# Patient Record
Sex: Male | Born: 1985 | Race: Black or African American | Hispanic: No | Marital: Single | Smoking: Never smoker
Health system: Southern US, Community
[De-identification: ages and names within clinical notes are randomized; demographics above are authoritative.]

---

## 2014-07-23 ENCOUNTER — Encounter (HOSPITAL_COMMUNITY): Payer: Self-pay | Admitting: Cardiology

## 2014-07-23 ENCOUNTER — Emergency Department (HOSPITAL_COMMUNITY)

## 2014-07-23 ENCOUNTER — Emergency Department (HOSPITAL_COMMUNITY)
Admission: EM | Admit: 2014-07-23 | Discharge: 2014-07-23 | Disposition: A | Discharge status: Home / Self Care | Attending: Emergency Medicine | Admitting: Emergency Medicine

## 2014-07-23 DIAGNOSIS — S20212A Contusion of left front wall of thorax, initial encounter: Secondary | ICD-10-CM

## 2014-07-23 DIAGNOSIS — Y9289 Other specified places as the place of occurrence of the external cause: Secondary | ICD-10-CM | POA: Diagnosis not present

## 2014-07-23 DIAGNOSIS — Y9389 Activity, other specified: Secondary | ICD-10-CM | POA: Insufficient documentation

## 2014-07-23 DIAGNOSIS — Y998 Other external cause status: Secondary | ICD-10-CM | POA: Diagnosis not present

## 2014-07-23 DIAGNOSIS — S29001A Unspecified injury of muscle and tendon of front wall of thorax, initial encounter: Secondary | ICD-10-CM | POA: Diagnosis present

## 2014-07-23 MED ORDER — IBUPROFEN 800 MG PO TABS: 800.0000 mg | ORAL_TABLET | Freq: Three times a day (TID) | ORAL | Status: AC

## 2014-07-23 NOTE — Discharge Instructions (Signed)
Rib Contusion °A rib contusion (bruise) can occur by a blow to the chest or by a fall against a hard object. Usually these will be much better in a couple weeks. If X-rays were taken today and there are no broken bones (fractures), the diagnosis of bruising is made. However, broken ribs may not show up for several days, or may be discovered later on a routine X-ray when signs of healing show up. If this happens to you, it does not mean that something was missed on the X-ray, but simply that it did not show up on the first X-rays. Earlier diagnosis will not usually change the treatment. °HOME CARE INSTRUCTIONS  °· Avoid strenuous activity. Be careful during activities and avoid bumping the injured ribs. Activities that pull on the injured ribs and cause pain should be avoided, if possible. °· For the first day or two, an ice pack used every 20 minutes while awake may be helpful. Put ice in a plastic bag and put a towel between the bag and the skin. °· Eat a normal, well-balanced diet. Drink plenty of fluids to avoid constipation. °· Take deep breaths several times a day to keep lungs free of infection. Try to cough several times a day. Splint the injured area with a pillow while coughing to ease pain. Coughing can help prevent pneumonia. °· Wear a rib belt or binder only if told to do so by your caregiver. If you are wearing a rib belt or binder, you must do the breathing exercises as directed by your caregiver. If not used properly, rib belts or binders restrict breathing which can lead to pneumonia. °· Only take over-the-counter or prescription medicines for pain, discomfort, or fever as directed by your caregiver. °SEEK MEDICAL CARE IF:  °· You or your child has an oral temperature above 102° F (38.9° C). °· Your baby is older than 3 months with a rectal temperature of 100.5° F (38.1° C) or higher for more than 1 day. °· You develop a cough, with thick or bloody sputum. °SEEK IMMEDIATE MEDICAL CARE IF:  °· You  have difficulty breathing. °· You feel sick to your stomach (nausea), have vomiting or belly (abdominal) pain. °· You have worsening pain, not controlled with medications, or there is a change in the location of the pain. °· You develop sweating or radiation of the pain into the arms, jaw or shoulders, or become light headed or faint. °· You or your child has an oral temperature above 102° F (38.9° C), not controlled by medicine. °· Your or your baby is older than 3 months with a rectal temperature of 102° F (38.9° C) or higher. °· Your baby is 3 months old or younger with a rectal temperature of 100.4° F (38° C) or higher. °MAKE SURE YOU:  °· Understand these instructions. °· Will watch your condition. °· Will get help right away if you are not doing well or get worse. °Document Released: 10/18/2000 Document Revised: 05/20/2012 Document Reviewed: 09/11/2007 °[employer]® Patient Information ©2015 [employer]. This information is not intended to replace advice given to you by your health care provider. Make sure you discuss any questions you have with your health care provider. ° °
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
## 2014-07-23 NOTE — ED Notes (Signed)
Patient with no complaints at this time. Respirations even and unlabored. Skin warm/dry. Discharge instructions reviewed with patient at this time. Patient given opportunity to voice concerns/ask questions. Patient discharged at this time and left Emergency Department with steady gait.   

## 2014-07-23 NOTE — ED Notes (Signed)
C/o L anterior rib pain just below nipple line.  Pain increases with deep breath.
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
## 2022-08-07 DEATH — deceased
